# Patient Record
Sex: Male | Born: 2000 | Race: Black or African American | Hispanic: No | Marital: Single | Smoking: Never smoker
Health system: Southern US, Community
[De-identification: ages and names within clinical notes are randomized; demographics above are authoritative.]

## PROBLEM LIST (undated history)

## (undated) DIAGNOSIS — W540XXA Bitten by dog, initial encounter: Secondary | ICD-10-CM

---
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
## 2023-03-20 IMAGING — CR DG ANKLE COMPLETE 3+V*R*
1 series · 3 of 3 positions shown · non-contrast
Comparison: None.

CLINICAL DATA: Right ankle injury yesterday, diffuse pain

EXAM:
RIGHT ANKLE - COMPLETE 3+ VIEW

[Series 1: dg ankle complete right · 0.14mm/px · 3 of 3 slices shown]
[im 1/3]
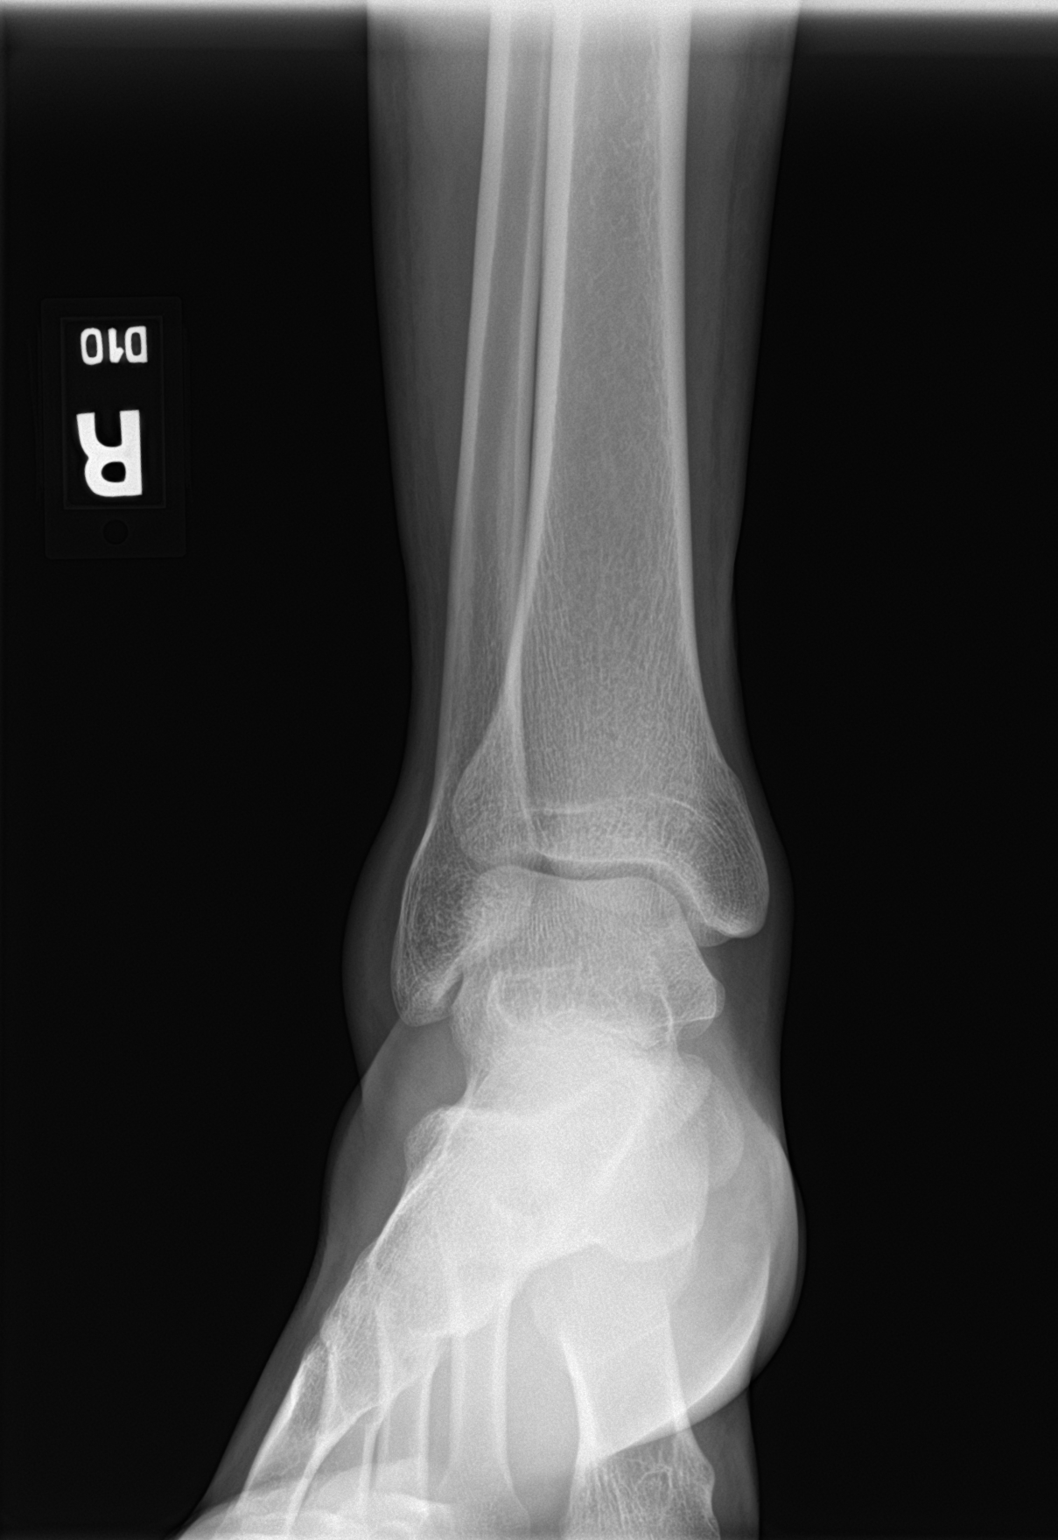
[im 2/3]
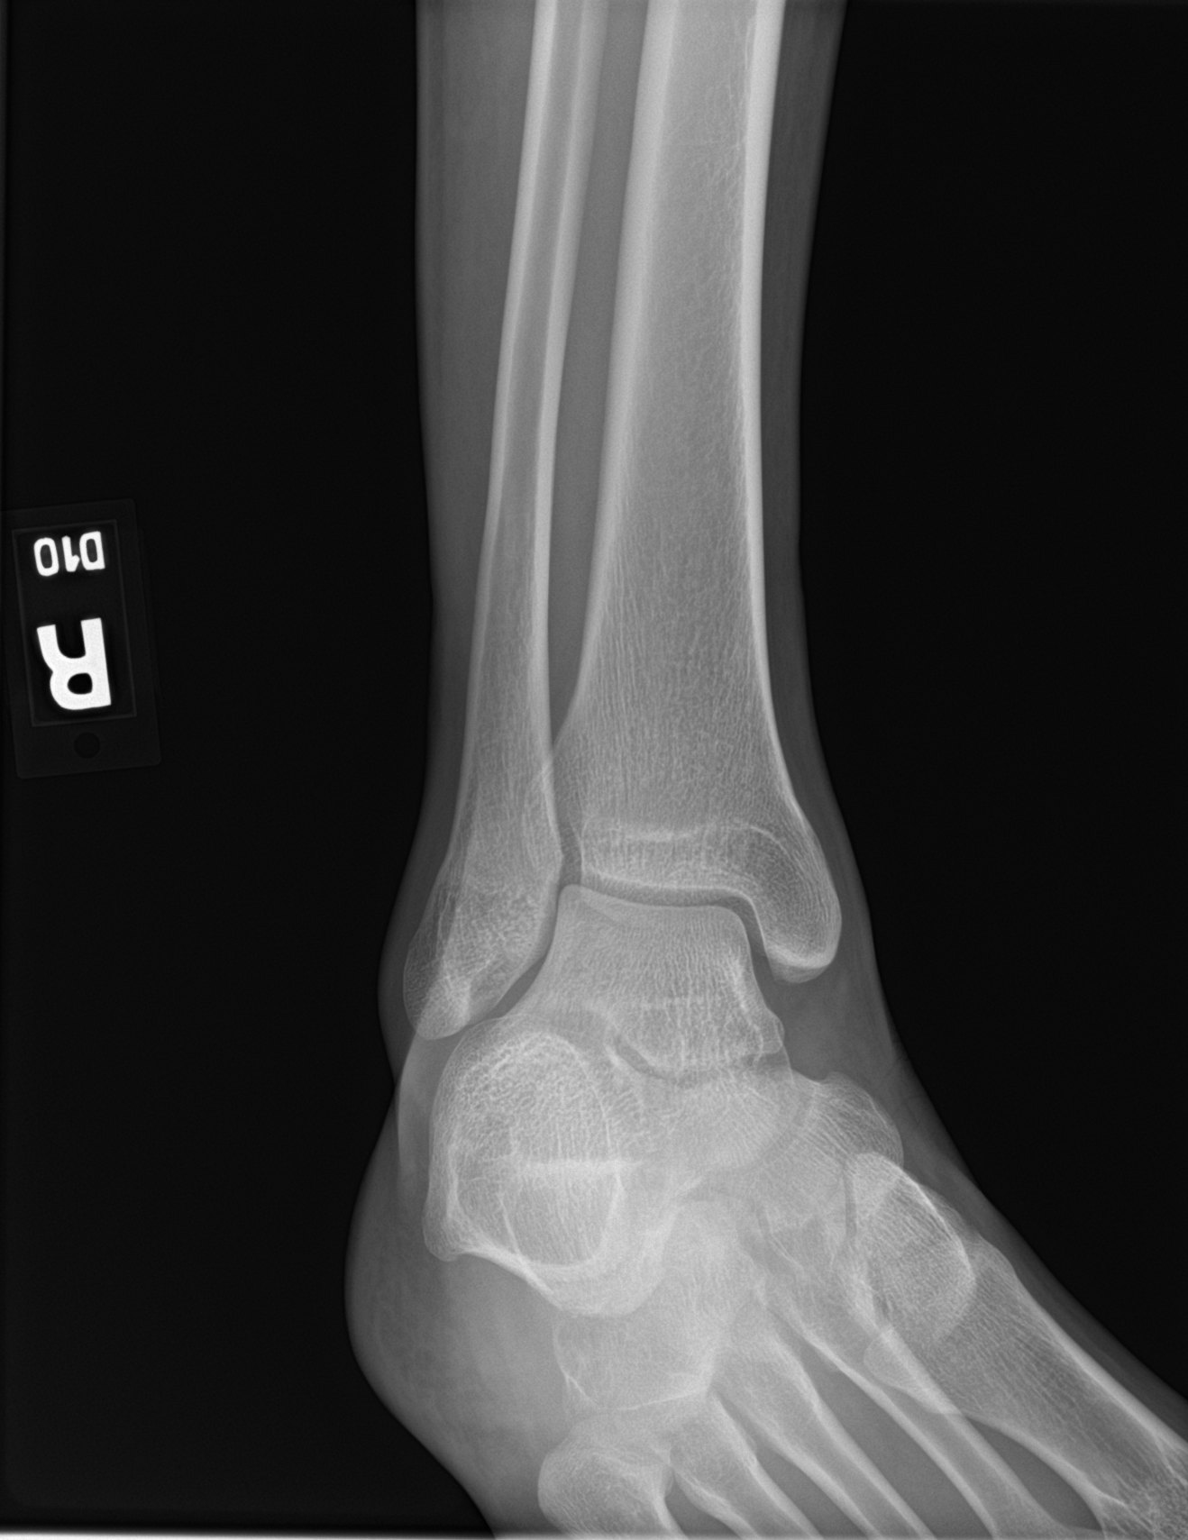
[im 3/3]
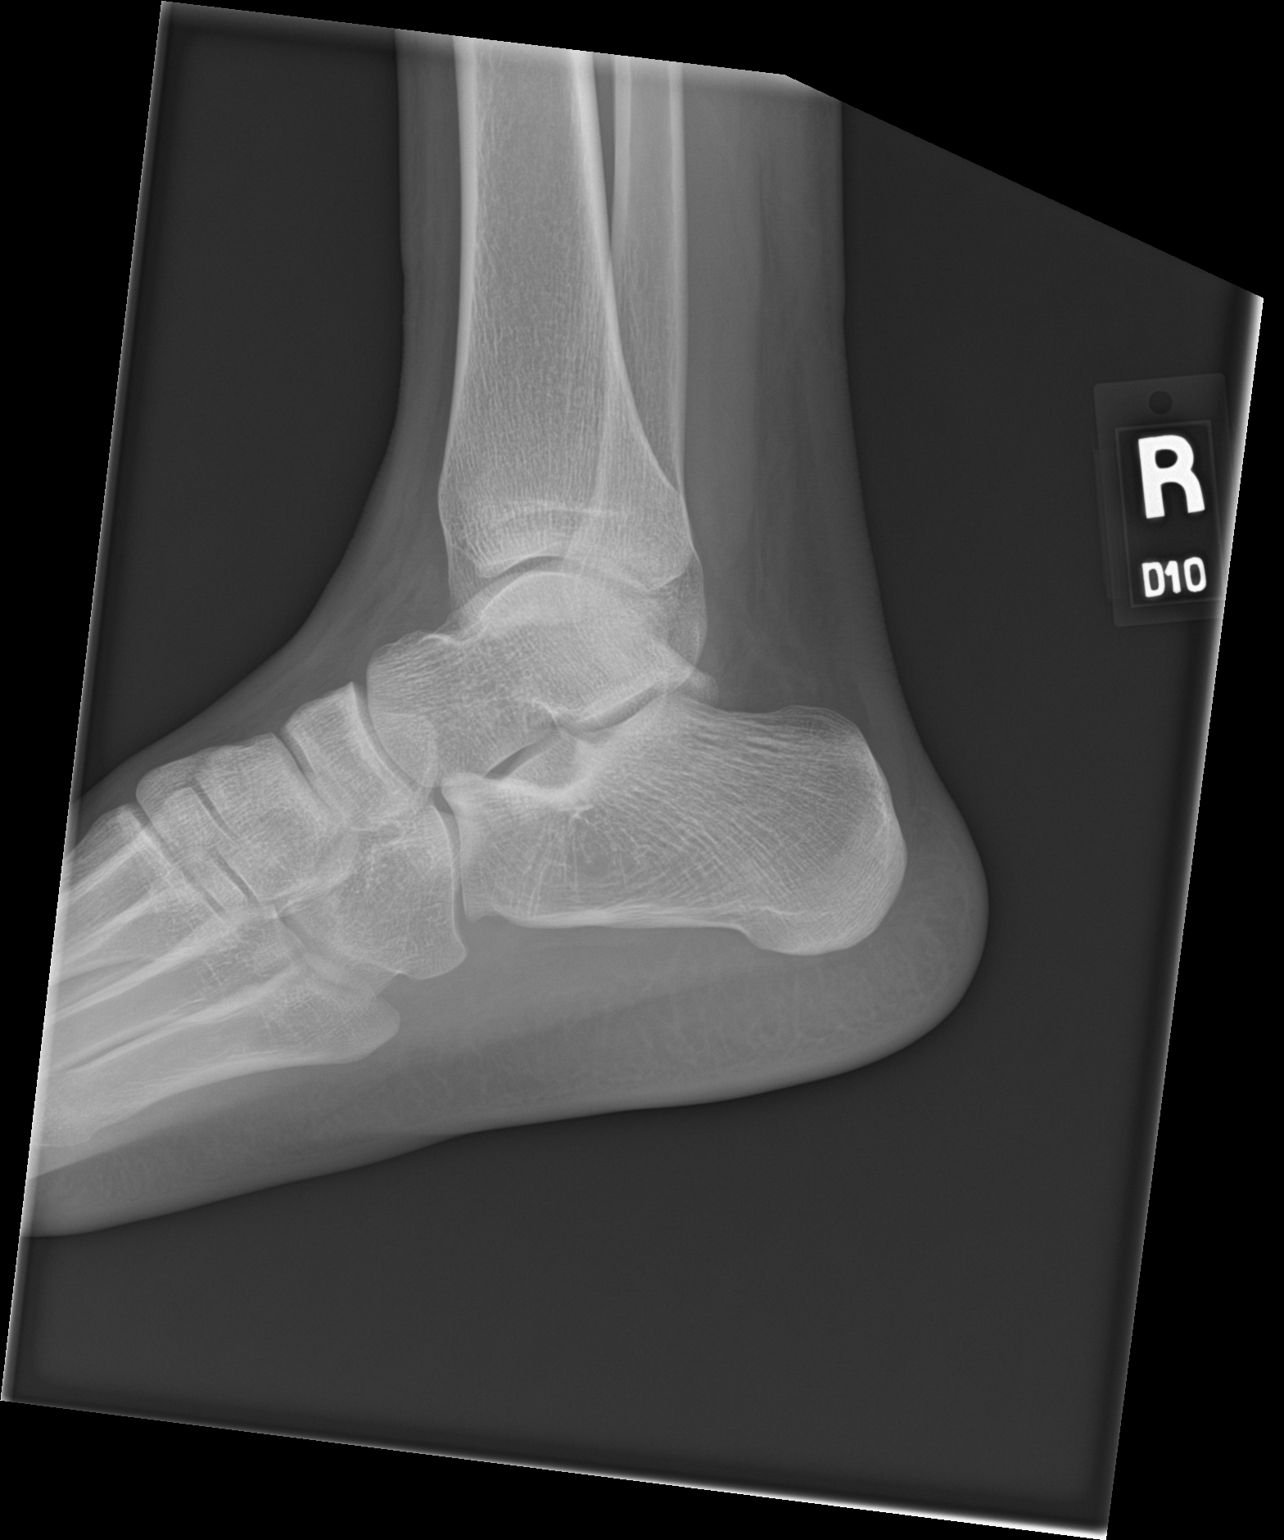

[3 of 3 positions shown; findings below may reference images not displayed]

FINDINGS: Frontal, oblique, and lateral views of the right ankle are obtained.
No fracture, subluxation, or dislocation. Joint spaces are well
preserved. Soft tissues are unremarkable.
IMPRESSION: 1. Unremarkable right ankle.
